# Patient Record
Sex: Female | Born: 1990 | Race: White | Hispanic: No | Marital: Single | Smoking: Never smoker
Health system: Southern US, Community
[De-identification: ages and names within clinical notes are randomized; demographics above are authoritative.]

---

## 2015-07-21 ENCOUNTER — Emergency Department (HOSPITAL_BASED_OUTPATIENT_CLINIC_OR_DEPARTMENT_OTHER)
Admission: EM | Admit: 2015-07-21 | Discharge: 2015-07-21 | Disposition: A | Discharge status: Home / Self Care | Payer: BLUE CROSS/BLUE SHIELD | Attending: Emergency Medicine | Admitting: Emergency Medicine

## 2015-07-21 ENCOUNTER — Encounter (HOSPITAL_BASED_OUTPATIENT_CLINIC_OR_DEPARTMENT_OTHER): Payer: Self-pay | Admitting: Emergency Medicine

## 2015-07-21 ENCOUNTER — Emergency Department (HOSPITAL_BASED_OUTPATIENT_CLINIC_OR_DEPARTMENT_OTHER): Payer: BLUE CROSS/BLUE SHIELD

## 2015-07-21 DIAGNOSIS — R0781 Pleurodynia: Secondary | ICD-10-CM | POA: Insufficient documentation

## 2015-07-21 DIAGNOSIS — R05 Cough: Secondary | ICD-10-CM | POA: Diagnosis present

## 2015-07-21 NOTE — ED Notes (Signed)
MD at bedside. 

## 2015-07-21 NOTE — ED Notes (Signed)
DC instructions reviewed with pt. Opportunity for questions provided

## 2015-07-21 NOTE — ED Notes (Signed)
Pt coughing since yesterday.  Started to have chest pain this am.  No recent travel.  Pt states it is hard to take a deep breath.  No acute respiratory distress noted.  Pt alert/oriented in NAD.

## 2015-07-21 NOTE — ED Notes (Signed)
States has had cough since yesterday, states having chest pain at left ant, non-radiating,, having stabbing, sharp pain.

## 2015-07-21 NOTE — ED Provider Notes (Signed)
CSN: [id number]     Arrival date & time 07/21/15  1042 History   First MD Initiated Contact with Patient 07/21/15 1047     Chief Complaint  Patient presents with  . Cough  . Chest Pain     (Consider location/radiation/quality/duration/timing/severity/associated sxs/prior Treatment) HPI   25 year old female with cough. Symptom onset yesterday. This point he felt some chest soreness. Worse with coughing. Feels like she cannot take a deep breath. Cough is nonproductive. No fevers or chills. No dizziness or lightheadedness. No unusual leg pain or swelling. No sick contacts. Has not tried taking anything for her symptoms.  No past medical history on file. No past surgical history on file. No family history on file. Social History  Substance Use Topics  . Smoking status: Never Smoker   . Smokeless tobacco: None  . Alcohol Use: None   OB History    No data available     Review of Systems  All systems reviewed and negative, other than as noted in HPI.   Allergies  Review of patient's allergies indicates no known allergies.  Home Medications   Prior to Admission medications   Not on File   BP 128/93 mmHg  Pulse 91  Temp(Src) 98.2 F (36.8 C) (Oral)  Resp 18  Ht  (1.676 m)  Wt 200 lb (90.719 kg)  BMI 32.30 kg/m2  SpO2 99%  LMP 06/30/2015 Physical Exam  Constitutional: She appears well-developed and well-nourished. No distress.  HENT:  Head: Normocephalic and atraumatic.  Eyes: Conjunctivae are normal. Right eye exhibits no discharge. Left eye exhibits no discharge.  Neck: Neck supple.  Cardiovascular: Normal rate, regular rhythm and normal heart sounds.  Exam reveals no gallop and no friction rub.   No murmur heard. Pulmonary/Chest: Effort normal and breath sounds normal. No respiratory distress.  Abdominal: Soft. She exhibits no distension. There is no tenderness.  Musculoskeletal: She exhibits no edema or tenderness.  Lower extremities symmetric as compared  to each other. No calf tenderness. Negative Homan's. No palpable cords.   Neurological: She is alert.  Skin: Skin is warm and dry.  Psychiatric: She has a normal mood and affect. Her behavior is normal. Thought content normal.  Nursing note and vitals reviewed.   ED Course  Procedures (including critical care time) Labs Review Labs Reviewed - No data to display  Imaging Review No results found. I have personally reviewed and evaluated these images and lab results as part of my medical decision-making.   EKG Interpretation   Date/Time:  Wednesday Jul 21 2015 10:52:58 EDT Ventricular Rate:  86 PR Interval:  130 QRS Duration: 102 QT Interval:  371 QTC Calculation: 444 R Axis:   64 Text Interpretation:  Sinus rhythm Borderline Q waves in inferior leads No  old tracing to compare Confirmed by Denisa Enterline  MD, Diamonique Ruedas ([id number]) on 07/21/2015  11:12:58 AM      MDM   Final diagnoses:  Pleuritic chest pain    25 year old female with chest pain which sounds pleuritic to me. Etiology is not completely clear. She is afebrile. Her exam is unremarkable. She has no increased work of breathing. Chest x-ray is without acute abnormality. Doubt PE. Doubt dissection, ACS or other emergent process.It has been determined that no acute conditions requiring further emergency intervention are present at this time. The patient has been advised of the diagnosis and plan. I reviewed any labs and imaging including any potential incidental findings. We have discussed signs and symptoms that warrant return to  the ED and they are listed in the discharge instructions.      Raeford RazorStephen Anasha Perfecto, MD 08/01/15 1244

## 2015-07-21 NOTE — Discharge Instructions (Signed)
Nonspecific Chest Pain  °Chest pain can be caused by many different conditions. There is always a chance that your pain could be related to something serious, such as a heart attack or a blood clot in your lungs. Chest pain can also be caused by conditions that are not life-threatening. If you have chest pain, it is very important to follow up with your health care provider. °CAUSES  °Chest pain can be caused by: °· Heartburn. °· Pneumonia or bronchitis. °· Anxiety or stress. °· Inflammation around your heart (pericarditis) or lung (pleuritis or pleurisy). °· A blood clot in your lung. °· A collapsed lung (pneumothorax). It can develop suddenly on its own (spontaneous pneumothorax) or from trauma to the chest. °· Shingles infection (varicella-zoster virus). °· Heart attack. °· Damage to the bones, muscles, and cartilage that make up your chest wall. This can include: °¨ Bruised bones due to injury. °¨ Strained muscles or cartilage due to frequent or repeated coughing or overwork. °¨ Fracture to one or more ribs. °¨ Sore cartilage due to inflammation (costochondritis). °RISK FACTORS  °Risk factors for chest pain may include: °· Activities that increase your risk for trauma or injury to your chest. °· Respiratory infections or conditions that cause frequent coughing. °· Medical conditions or overeating that can cause heartburn. °· Heart disease or family history of heart disease. °· Conditions or health behaviors that increase your risk of developing a blood clot. °· Having had chicken pox (varicella zoster). °SIGNS AND SYMPTOMS °Chest pain can feel like: °· Burning or tingling on the surface of your chest or deep in your chest. °· Crushing, pressure, aching, or squeezing pain. °· Dull or sharp pain that is worse when you move, cough, or take a deep breath. °· Pain that is also felt in your back, neck, shoulder, or arm, or pain that spreads to any of these areas. °Your chest pain may come and go, or it may stay  constant. °DIAGNOSIS °Lab tests or other studies may be needed to find the cause of your pain. Your health care provider may have you take a test called an ambulatory ECG (electrocardiogram). An ECG records your heartbeat patterns at the time the test is performed. You may also have other tests, such as: °· Transthoracic echocardiogram (TTE). During echocardiography, sound waves are used to create a picture of all of the heart structures and to look at how blood flows through your heart. °· Transesophageal echocardiogram (TEE). This is a more advanced imaging test that obtains images from inside your body. It allows your health care provider to see your heart in finer detail. °· Cardiac monitoring. This allows your health care provider to monitor your heart rate and rhythm in real time. °· Holter monitor. This is a portable device that records your heartbeat and can help to diagnose abnormal heartbeats. It allows your health care provider to track your heart activity for several days, if needed. °· Stress tests. These can be done through exercise or by taking medicine that makes your heart beat more quickly. °· Blood tests. °· Imaging tests. °TREATMENT  °Your treatment depends on what is causing your chest pain. Treatment may include: °· Medicines. These may include: °¨ Acid blockers for heartburn. °¨ Anti-inflammatory medicine. °¨ Pain medicine for inflammatory conditions. °¨ Antibiotic medicine, if an infection is present. °¨ Medicines to dissolve blood clots. °¨ Medicines to treat coronary artery disease. °· Supportive care for conditions that do not require medicines. This may include: °¨ Resting. °¨ Applying heat   or cold packs to injured areas. °¨ Limiting activities until pain decreases. °HOME CARE INSTRUCTIONS °· If you were prescribed an antibiotic medicine, finish it all even if you start to feel better. °· Avoid any activities that bring on chest pain. °· Do not use any tobacco products, including  cigarettes, chewing tobacco, or electronic cigarettes. If you need help quitting, ask your health care provider. °· Do not drink alcohol. °· Take medicines only as directed by your health care provider. °· Keep all follow-up visits as directed by your health care provider. This is important. This includes any further testing if your chest pain does not go away. °· If heartburn is the cause for your chest pain, you may be told to keep your head raised (elevated) while sleeping. This reduces the chance that acid will go from your stomach into your esophagus. °· Make lifestyle changes as directed by your health care provider. These may include: °¨ Getting regular exercise. Ask your health care provider to suggest some activities that are safe for you. °¨ Eating a heart-healthy diet. A registered dietitian can help you to learn healthy eating options. °¨ Maintaining a healthy weight. °¨ Managing diabetes, if necessary. °¨ Reducing stress. °SEEK MEDICAL CARE IF: °· Your chest pain does not go away after treatment. °· You have a rash with blisters on your chest. °· You have a fever. °SEEK IMMEDIATE MEDICAL CARE IF:  °· Your chest pain is worse. °· You have an increasing cough, or you cough up blood. °· You have severe abdominal pain. °· You have severe weakness. °· You faint. °· You have chills. °· You have sudden, unexplained chest discomfort. °· You have sudden, unexplained discomfort in your arms, back, neck, or jaw. °· You have shortness of breath at any time. °· You suddenly start to sweat, or your skin gets clammy. °· You feel nauseous or you vomit. °· You suddenly feel light-headed or dizzy. °· Your heart begins to beat quickly, or it feels like it is skipping beats. °These symptoms may represent a serious problem that is an emergency. Do not wait to see if the symptoms will go away. Get medical help right away. Call your local emergency services (911 in the [place] Do not drive yourself to the hospital. °  °This  information is not intended to replace advice given to you by your health care provider. Make sure you discuss any questions you have with your health care provider. °  °Document Released: 11/16/2004 Document Revised: 02/27/2014 Document Reviewed: 09/12/2013 °[employer] Interactive Patient Education ©2016 [employer] ° °

## 2017-09-13 IMAGING — CR DG CHEST 2V
2 series · 2 of 2 positions shown · non-contrast
Comparison: None.

CLINICAL DATA: Cough since last night. Left-sided chest pain
starting today.

EXAM:
CHEST  2 VIEW

[w chest pa]
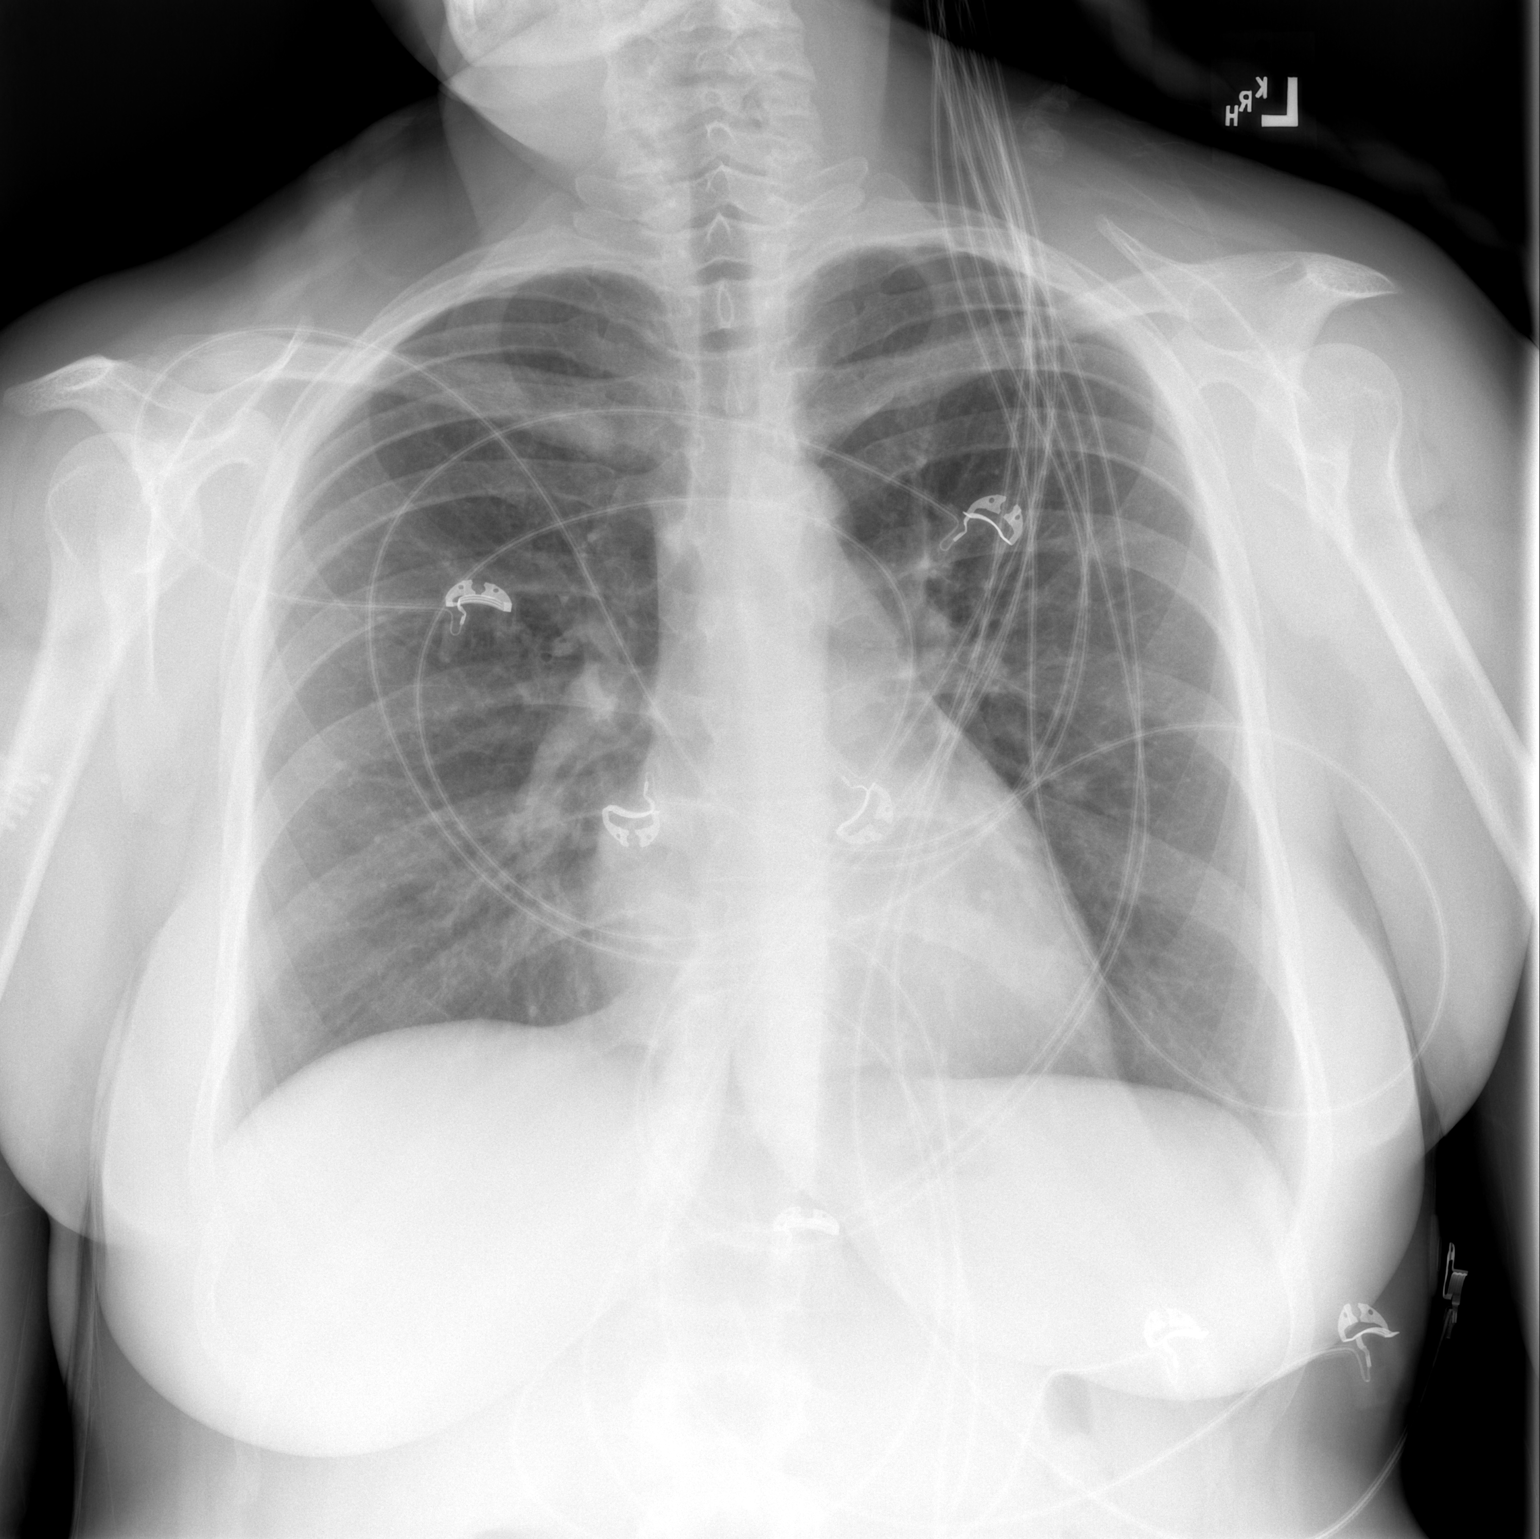

[w chest lat]
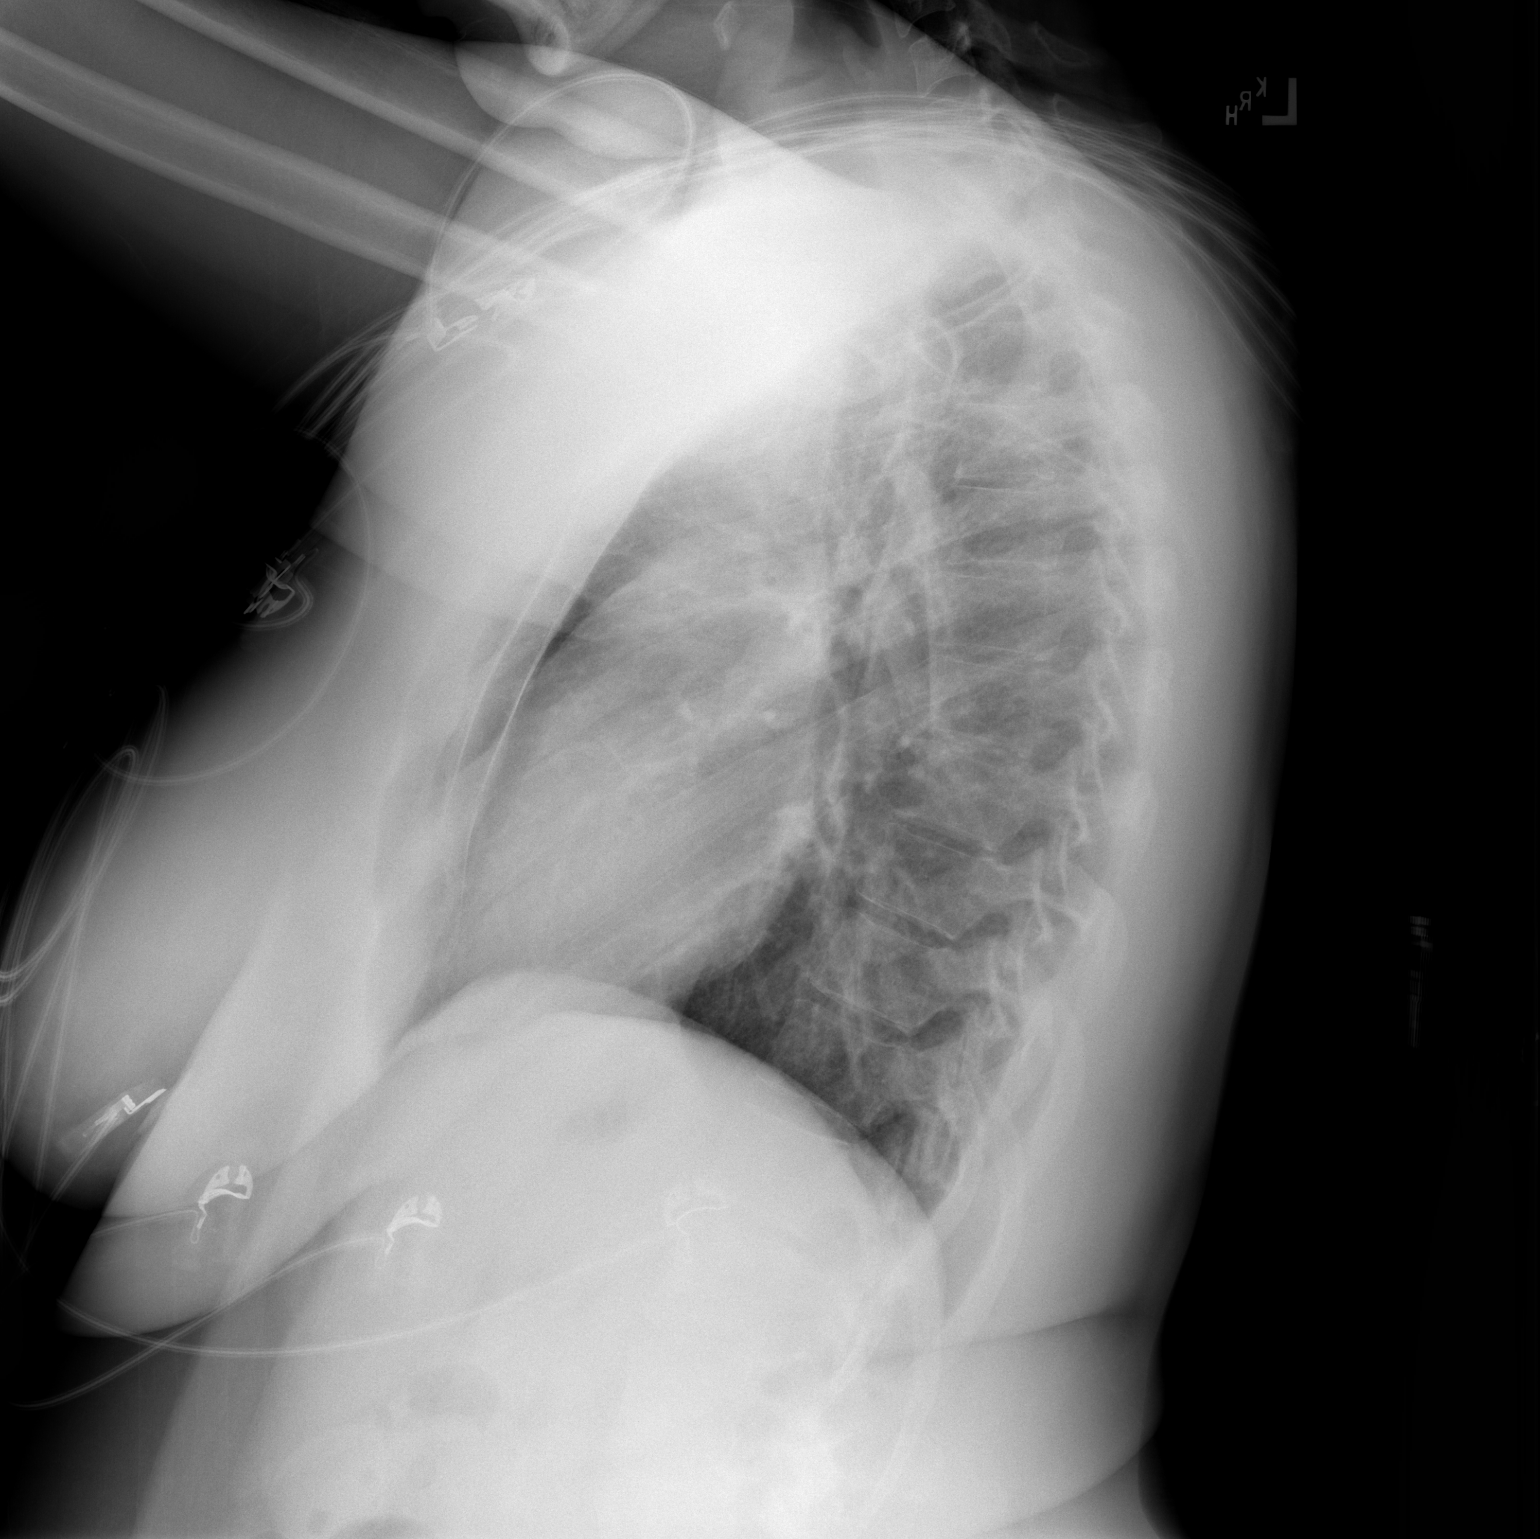

[2 of 2 positions shown; findings below may reference images not displayed]

FINDINGS: The heart size and mediastinal contours are within normal limits.
Both lungs are clear. The visualized skeletal structures are
unremarkable.
IMPRESSION: No active cardiopulmonary disease.
